# Patient Record
Sex: Female | Born: 1970 | Race: Black or African American | Hispanic: No | Marital: Single | Smoking: Never smoker
Health system: Southern US, Community
[De-identification: ages and names within clinical notes are randomized; demographics above are authoritative.]

## PROBLEM LIST (undated history)

## (undated) DIAGNOSIS — J45909 Unspecified asthma, uncomplicated: Secondary | ICD-10-CM

---

## 1999-11-19 ENCOUNTER — Encounter: Payer: Self-pay | Admitting: Emergency Medicine

## 1999-11-19 ENCOUNTER — Emergency Department (HOSPITAL_COMMUNITY): Admission: EM | Admit: 1999-11-19 | Discharge: 1999-11-19 | Payer: Self-pay | Admitting: Emergency Medicine

## 2008-10-30 ENCOUNTER — Emergency Department (HOSPITAL_COMMUNITY): Admission: EM | Admit: 2008-10-30 | Discharge: 2008-10-30 | Payer: Self-pay | Admitting: Emergency Medicine

## 2008-11-10 ENCOUNTER — Encounter: Admission: RE | Admit: 2008-11-10 | Discharge: 2008-11-10 | Payer: Self-pay | Admitting: Internal Medicine

## 2009-01-30 ENCOUNTER — Emergency Department (HOSPITAL_COMMUNITY): Admission: EM | Admit: 2009-01-30 | Discharge: 2009-01-30 | Payer: Self-pay | Admitting: Family Medicine

## 2009-07-13 ENCOUNTER — Emergency Department (HOSPITAL_COMMUNITY): Admission: EM | Admit: 2009-07-13 | Discharge: 2009-07-13 | Payer: Self-pay | Admitting: Family Medicine

## 2009-07-16 ENCOUNTER — Ambulatory Visit (HOSPITAL_COMMUNITY): Admission: RE | Admit: 2009-07-16 | Discharge: 2009-07-16 | Payer: Self-pay | Admitting: Orthopedic Surgery

## 2010-10-06 ENCOUNTER — Ambulatory Visit (HOSPITAL_COMMUNITY): Admission: RE | Admit: 2010-10-06 | Discharge: 2010-10-06 | Payer: Self-pay | Admitting: Chiropractic Medicine

## 2011-04-13 ENCOUNTER — Ambulatory Visit: Payer: Self-pay | Admitting: Physical Therapy

## 2011-04-20 ENCOUNTER — Ambulatory Visit: Payer: Self-pay | Admitting: Physical Therapy

## 2012-11-20 ENCOUNTER — Other Ambulatory Visit (HOSPITAL_COMMUNITY): Payer: Self-pay | Admitting: Internal Medicine

## 2012-11-20 ENCOUNTER — Encounter (HOSPITAL_COMMUNITY): Payer: Self-pay

## 2012-11-20 ENCOUNTER — Ambulatory Visit (HOSPITAL_COMMUNITY)
Admission: RE | Admit: 2012-11-20 | Discharge: 2012-11-20 | Disposition: A | Discharge status: Home / Self Care | Payer: 59 | Source: Ambulatory Visit | Attending: Internal Medicine | Admitting: Internal Medicine

## 2012-11-20 DIAGNOSIS — R197 Diarrhea, unspecified: Secondary | ICD-10-CM | POA: Insufficient documentation

## 2012-11-20 DIAGNOSIS — R109 Unspecified abdominal pain: Secondary | ICD-10-CM

## 2012-11-20 DIAGNOSIS — M51379 Other intervertebral disc degeneration, lumbosacral region without mention of lumbar back pain or lower extremity pain: Secondary | ICD-10-CM | POA: Insufficient documentation

## 2012-11-20 DIAGNOSIS — R599 Enlarged lymph nodes, unspecified: Secondary | ICD-10-CM | POA: Insufficient documentation

## 2012-11-20 DIAGNOSIS — M5137 Other intervertebral disc degeneration, lumbosacral region: Secondary | ICD-10-CM | POA: Insufficient documentation

## 2012-11-20 DIAGNOSIS — R1031 Right lower quadrant pain: Secondary | ICD-10-CM | POA: Insufficient documentation

## 2012-11-20 HISTORY — DX: Unspecified asthma, uncomplicated: J45.909

## 2013-08-25 ENCOUNTER — Ambulatory Visit (INDEPENDENT_AMBULATORY_CARE_PROVIDER_SITE_OTHER): Payer: 59 | Admitting: Podiatry

## 2013-08-25 ENCOUNTER — Encounter: Payer: Self-pay | Admitting: Podiatry

## 2013-08-25 VITALS — BP 111/77 | HR 55 | Ht 65.0 in | Wt 150.0 lb

## 2013-08-25 DIAGNOSIS — L603 Nail dystrophy: Secondary | ICD-10-CM

## 2013-08-25 DIAGNOSIS — B351 Tinea unguium: Secondary | ICD-10-CM | POA: Insufficient documentation

## 2013-08-25 DIAGNOSIS — L608 Other nail disorders: Secondary | ICD-10-CM

## 2013-08-25 MED ORDER — CICLOPIROX 8 % EX SOLN: Freq: Every day | CUTANEOUS | Status: DC

## 2013-08-25 NOTE — Progress Notes (Signed)
Noted discolored and deformed nail two weeks ago.   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  Endocrine ROS: negative Breast ROS: negative for breast lumps Respiratory ROS: no cough, shortness of breath, or wheezing Gastrointestinal ROS: no abdominal pain, change in bowel habits, or black or bloody stools Genito-Urinary ROS: no dysuria, trouble voiding, or hematuria Musculoskeletal ROS: left knee cap pops and wears knee brace. Diabnosed for [employer].  Neurological ROS: no TIA or stroke symptoms Dermatological ROS: Only with discolored toe nails.   Objective: Thick dystrophic nails 1st and 2nd right. Discolored nails x 10. Neurovascular status are within normal.  Assessment: Onychomycosis x 10. With severe deformity on right hallux.  Plan: Reviewed all available treatment options.  Will try Penlac for the next 12 months.

## 2013-08-25 NOTE — Patient Instructions (Addendum)
Seen for deformed fungal infected right great toe. May benefit from Penlac topical medication.  Will continue for the next 12 months.  Maintain both feet clean and dry.  Return in 1 month.

## 2013-09-24 ENCOUNTER — Ambulatory Visit: Payer: 59 | Admitting: Podiatry

## 2013-10-14 ENCOUNTER — Other Ambulatory Visit (HOSPITAL_COMMUNITY): Payer: Self-pay | Admitting: Obstetrics and Gynecology

## 2013-10-14 DIAGNOSIS — Z1231 Encounter for screening mammogram for malignant neoplasm of breast: Secondary | ICD-10-CM

## 2013-11-19 ENCOUNTER — Ambulatory Visit (HOSPITAL_COMMUNITY): Payer: 59

## 2013-12-10 ENCOUNTER — Ambulatory Visit (HOSPITAL_COMMUNITY): Payer: 59 | Attending: Obstetrics and Gynecology

## 2013-12-17 ENCOUNTER — Emergency Department (HOSPITAL_COMMUNITY): Payer: 59

## 2013-12-17 ENCOUNTER — Emergency Department (HOSPITAL_COMMUNITY)
Admission: EM | Admit: 2013-12-17 | Discharge: 2013-12-17 | Disposition: A | Discharge status: Home / Self Care | Payer: 59 | Attending: Emergency Medicine | Admitting: Emergency Medicine

## 2013-12-17 ENCOUNTER — Encounter (HOSPITAL_COMMUNITY): Payer: Self-pay | Admitting: Emergency Medicine

## 2013-12-17 DIAGNOSIS — S40019A Contusion of unspecified shoulder, initial encounter: Secondary | ICD-10-CM | POA: Insufficient documentation

## 2013-12-17 DIAGNOSIS — W010XXA Fall on same level from slipping, tripping and stumbling without subsequent striking against object, initial encounter: Secondary | ICD-10-CM | POA: Insufficient documentation

## 2013-12-17 DIAGNOSIS — S40011A Contusion of right shoulder, initial encounter: Secondary | ICD-10-CM

## 2013-12-17 DIAGNOSIS — Y939 Activity, unspecified: Secondary | ICD-10-CM | POA: Insufficient documentation

## 2013-12-17 DIAGNOSIS — Y929 Unspecified place or not applicable: Secondary | ICD-10-CM | POA: Insufficient documentation

## 2013-12-17 DIAGNOSIS — IMO0002 Reserved for concepts with insufficient information to code with codable children: Secondary | ICD-10-CM | POA: Insufficient documentation

## 2013-12-17 DIAGNOSIS — S43401A Unspecified sprain of right shoulder joint, initial encounter: Secondary | ICD-10-CM

## 2013-12-17 DIAGNOSIS — J45909 Unspecified asthma, uncomplicated: Secondary | ICD-10-CM | POA: Insufficient documentation

## 2013-12-17 DIAGNOSIS — W108XXA Fall (on) (from) other stairs and steps, initial encounter: Secondary | ICD-10-CM | POA: Insufficient documentation

## 2013-12-17 DIAGNOSIS — Z79899 Other long term (current) drug therapy: Secondary | ICD-10-CM | POA: Insufficient documentation

## 2013-12-17 MED ORDER — IBUPROFEN 800 MG PO TABS
800.0000 mg | ORAL_TABLET | Freq: Once | ORAL | Status: AC
  Administered 2013-12-17: 800 mg via ORAL
  Filled 2013-12-17: qty 1

## 2013-12-17 MED ORDER — TRAMADOL HCL 50 MG PO TABS: 50.0000 mg | ORAL_TABLET | Freq: Four times a day (QID) | ORAL | Status: AC | PRN

## 2013-12-17 MED ORDER — ACETAMINOPHEN 325 MG PO TABS
650.0000 mg | ORAL_TABLET | Freq: Once | ORAL | Status: AC
  Administered 2013-12-17: 650 mg via ORAL
  Filled 2013-12-17: qty 2

## 2013-12-17 NOTE — Discharge Instructions (Signed)

## 2013-12-17 NOTE — ED Provider Notes (Signed)
CSN: [id number]     Arrival date & time 12/17/13  [id number] History   First MD Initiated Contact with Patient 12/17/13 [phone]     Chief Complaint  Patient presents with  . Fall  . Shoulder Pain   (Consider location/radiation/quality/duration/timing/severity/associated sxs/prior Treatment) Patient is a 43 y.o. female presenting with fall and shoulder pain. The history is provided by the patient.  Fall This is a new (slipped on the ice and fell down 5 stairs) problem. The current episode started less than 1 hour ago. The problem occurs constantly. The problem has not changed since onset.Associated symptoms comments: Pain in the back of right shoulder and upper back pain.  Also tailbone pain.  No head injury, LOC or neck pain.  . The symptoms are aggravated by twisting and bending. The symptoms are relieved by rest. She has tried rest for the symptoms. The treatment provided no relief.  Shoulder Pain    Past Medical History  Diagnosis Date  . Asthma    History reviewed. No pertinent past surgical history. History reviewed. No pertinent family history. History  Substance Use Topics  . Smoking status: Never Smoker   . Smokeless tobacco: Never Used  . Alcohol Use: Not on file   OB History   Grav Para Term Preterm Abortions TAB SAB Ect Mult Living                 Review of Systems  Neurological: Negative for weakness and numbness.  All other systems reviewed and are negative.    Allergies  Review of patient's allergies indicates no known allergies.  Home Medications   Current Outpatient Rx  Name  Route  Sig  Dispense  Refill  . meloxicam (MOBIC) 15 MG tablet   Oral   Take 15 mg by mouth as needed for pain.         Marland Kitchen. albuterol (PROAIR HFA) 108 (90 BASE) MCG/ACT inhaler   Inhalation   Inhale 2 puffs into the lungs every 6 (six) hours as needed for wheezing.          BP 111/87  Pulse 78  Temp(Src) 97.6 F (36.4 C) (Oral)  Resp 14  SpO2 97% Physical Exam  Nursing note  and vitals reviewed. Constitutional: She is oriented to person, place, and time. She appears well-developed and well-nourished. No distress.  HENT:  Head: Normocephalic and atraumatic.  Eyes: EOM are normal. Pupils are equal, round, and reactive to light.  Neck: No spinous process tenderness and no muscular tenderness present.  Cardiovascular: Normal rate and intact distal pulses.   Pulmonary/Chest: Effort normal.  Musculoskeletal:       Right shoulder: She exhibits bony tenderness. She exhibits no deformity and normal strength.       Right elbow: Normal.      Right wrist: Normal.       Thoracic back: She exhibits tenderness, bony tenderness, pain and spasm. She exhibits no deformity and normal pulse.       Back:  Pain over the right scapula, tenderness with ROM.  No clavicular tenderness.    Neurological: She is alert and oriented to person, place, and time. She has normal strength. No sensory deficit.  Skin: Skin is warm and dry. No rash noted. No erythema.  Psychiatric: She has a normal mood and affect. Her behavior is normal.    ED Course  Procedures (including critical care time) Labs Review Labs Reviewed - No data to display Imaging Review Dg Thoracic Spine W/swimmers  12/17/2013  CLINICAL DATA:  Pain between shoulder blades and in right shoulder, fell down stairs this morning  EXAM: THORACIC SPINE - 2 VIEW + SWIMMERS  COMPARISON:  10/06/2010  FINDINGS: Twelve pairs of ribs.  Vertebral body and disc space heights maintained.  No acute fracture, subluxation or bone destruction.  Visualized posterior ribs intact.  IMPRESSION: No acute osseous abnormalities.   Electronically Signed   By: Ulyses Southward M.D.   On: 12/17/2013 09:25   Dg Shoulder Right  12/17/2013   CLINICAL DATA:  43 year old female status post fall downstairs with pain. Initial encounter.  EXAM: RIGHT SHOULDER - 2+ VIEW  COMPARISON:  None.  FINDINGS: Bone mineralization is within normal limits. No glenohumeral joint  dislocation. Proximal right humerus intact. Right clavicle and scapula appear intact. Visualized right ribs and lung parenchyma within normal limits.  IMPRESSION: No acute fracture or dislocation identified about the right shoulder.   Electronically Signed   By: Augusto Gamble M.D.   On: 12/17/2013 09:20    EKG Interpretation   None       MDM   1. Fall down stairs   2. Sprain of shoulder, right   3. Contusion of scapula, right     Patient with a mechanical fall when she slipped on the ice this morning. She is complaining of posterior right shoulder and thoracic pain. No headache head injury or LOC. No right elbow or wrist pain 2+ radial pulse normal sensation and strength of the hands. Also complaining of coccyx pain but is able to walk without difficulty.  Plain film of the shoulder and thoracic spine pending. Patient given ibuprofen and Tylenol for pain.  9:27 AM Plain films neg.  Will d/c home with supportive care.  Gwyneth Sprout, MD 12/17/13 [phone]

## 2013-12-17 NOTE — ED Notes (Signed)
Per pt, coming down stairs and fell striking rt arm.  C/O pain to rt shoulder and neck.  No LOC.  States did not strike head.

## 2016-02-12 ENCOUNTER — Encounter (HOSPITAL_BASED_OUTPATIENT_CLINIC_OR_DEPARTMENT_OTHER): Payer: Self-pay | Admitting: Emergency Medicine

## 2016-02-12 DIAGNOSIS — J45909 Unspecified asthma, uncomplicated: Secondary | ICD-10-CM | POA: Diagnosis not present

## 2016-02-12 DIAGNOSIS — Z79899 Other long term (current) drug therapy: Secondary | ICD-10-CM | POA: Insufficient documentation

## 2016-02-12 DIAGNOSIS — J069 Acute upper respiratory infection, unspecified: Secondary | ICD-10-CM | POA: Insufficient documentation

## 2016-02-12 DIAGNOSIS — J029 Acute pharyngitis, unspecified: Secondary | ICD-10-CM | POA: Diagnosis present

## 2016-02-12 LAB — RAPID STREP SCREEN (MED CTR MEBANE ONLY): Streptococcus, Group A Screen (Direct): NEGATIVE

## 2016-02-12 NOTE — ED Notes (Signed)
Patient states that she is having chills and ached x 2 -3 days. Also reports a sore throat. The patient states that she came in tonight because it is not getting any better

## 2016-02-13 ENCOUNTER — Emergency Department (HOSPITAL_BASED_OUTPATIENT_CLINIC_OR_DEPARTMENT_OTHER)
Admission: EM | Admit: 2016-02-13 | Discharge: 2016-02-13 | Disposition: A | Discharge status: Home / Self Care | Payer: BC Managed Care – PPO | Attending: Emergency Medicine | Admitting: Emergency Medicine

## 2016-02-13 DIAGNOSIS — J302 Other seasonal allergic rhinitis: Secondary | ICD-10-CM

## 2016-02-13 DIAGNOSIS — J069 Acute upper respiratory infection, unspecified: Secondary | ICD-10-CM

## 2016-02-13 MED ORDER — FLUTICASONE PROPIONATE 50 MCG/ACT NA SUSP: 2.0000 | Freq: Every day | NASAL | Status: AC

## 2016-02-13 MED ORDER — IBUPROFEN 600 MG PO TABS: 600.0000 mg | ORAL_TABLET | Freq: Four times a day (QID) | ORAL | Status: AC | PRN

## 2016-02-13 MED ORDER — IBUPROFEN 400 MG PO TABS
600.0000 mg | ORAL_TABLET | Freq: Once | ORAL | Status: AC
  Administered 2016-02-13: 600 mg via ORAL
  Filled 2016-02-13: qty 1

## 2016-02-13 MED ORDER — LORATADINE 10 MG PO TABS: 10.0000 mg | ORAL_TABLET | Freq: Every day | ORAL | Status: AC

## 2016-02-13 NOTE — Discharge Instructions (Signed)
Allergies An allergy is an abnormal reaction to a substance by the body's defense system (immune system). Allergies can develop at any age. WHAT CAUSES ALLERGIES? An allergic reaction happens when the immune system mistakenly reacts to a normally harmless substance, called an allergen, as if it were harmful. The immune system releases antibodies to fight the substance. Antibodies eventually release a chemical called histamine into the bloodstream. The release of histamine is meant to protect the body from infection, but it also causes discomfort. An allergic reaction can be triggered by:  Eating an allergen.  Inhaling an allergen.  Touching an allergen. WHAT TYPES OF ALLERGIES ARE THERE? There are many types of allergies. Common types include:  Seasonal allergies. People with this type of allergy are usually allergic to substances that are only present during certain seasons, such as molds and pollens.  Food allergies.  Drug allergies.  Insect allergies.  Animal dander allergies. WHAT ARE SYMPTOMS OF ALLERGIES? Possible allergy symptoms include:  Swelling of the lips, face, tongue, mouth, or throat.  Sneezing, coughing, or wheezing.  Nasal congestion.  Tingling in the mouth.  Rash.  Itching.  Itchy, red, swollen areas of skin (hives).  Watery eyes.  Vomiting.  Diarrhea.  Dizziness.  Lightheadedness.  Fainting.  Trouble breathing or swallowing.  Chest tightness.  Rapid heartbeat. HOW ARE ALLERGIES DIAGNOSED? Allergies are diagnosed with a medical and family history and one or more of the following:  Skin tests.  Blood tests.  A food diary. A food diary is a record of all the foods and drinks you have in a day and of all the symptoms you experience.  The results of an elimination diet. An elimination diet involves eliminating foods from your diet and then adding them back in one by one to find out if a certain food causes an allergic reaction. HOW ARE  ALLERGIES TREATED? There is no cure for allergies, but allergic reactions can be treated with medicine. Severe reactions usually need to be treated at a hospital. HOW CAN REACTIONS BE PREVENTED? The best way to prevent an allergic reaction is by avoiding the substance you are allergic to. Allergy shots and medicines can also help prevent reactions in some cases. People with severe allergic reactions may be able to prevent a life-threatening reaction called anaphylaxis with a medicine given right after exposure to the allergen.   This information is not intended to replace advice given to you by your health care provider. Make sure you discuss any questions you have with your health care provider.   Document Released: 02/13/2003 Document Revised: 12/11/2014 Document Reviewed: 09/01/2014 Elsevier Interactive Patient Education 2016 Else[employer] Inc[employer]   Upper Respiratory Infection, Adult Most upper respiratory infections (URIs) are a viral infection of the air passages leading to the lungs. A URI affects the nose, throat, and upper air passages. The most common type of URI is nasopharyngitis and is typically referred to as "the common cold." URIs run their course and usually go away on their own. Most of the time, a URI does not require medical attention, but sometimes a bacterial infection in the upper airways can follow a viral infection. This is called a secondary infection. Sinus and middle ear infections are common types of secondary upper respiratory infections. Bacterial pneumonia can also complicate a URI. A URI can worsen asthma and chronic obstructive pulmonary disease (COPD). Sometimes, these complications can require emergency medical care and may be life threatening.  CAUSES Almost all URIs are caused by viruses. A virus   is a type of germ and can spread from one person to another.  RISKS FACTORS You may be at risk for a URI if:   You smoke.   You have chronic heart or lung disease.  You  have a weakened defense (immune) system.   You are very young or very old.   You have nasal allergies or asthma.  You work in crowded or poorly ventilated areas.  You work in health care facilities or schools. SIGNS AND SYMPTOMS  Symptoms typically develop 2-3 days after you come in contact with a cold virus. Most viral URIs last 7-10 days. However, viral URIs from the influenza virus (flu virus) can last 14-18 days and are typically more severe. Symptoms may include:   Runny or stuffy (congested) nose.   Sneezing.   Cough.   Sore throat.   Headache.   Fatigue.   Fever.   Loss of appetite.   Pain in your forehead, behind your eyes, and over your cheekbones (sinus pain).  Muscle aches.  DIAGNOSIS  Your health care provider may diagnose a URI by:  Physical exam.  Tests to check that your symptoms are not due to another condition such as:  Strep throat.  Sinusitis.  Pneumonia.  Asthma. TREATMENT  A URI goes away on its own with time. It cannot be cured with medicines, but medicines may be prescribed or recommended to relieve symptoms. Medicines may help:  Reduce your fever.  Reduce your cough.  Relieve nasal congestion. HOME CARE INSTRUCTIONS   Take medicines only as directed by your health care provider.   Gargle warm saltwater or take cough drops to comfort your throat as directed by your health care provider.  Use a warm mist humidifier or inhale steam from a shower to increase air moisture. This may make it easier to breathe.  Drink enough fluid to keep your urine clear or pale yellow.   Eat soups and other clear broths and maintain good nutrition.   Rest as needed.   Return to work when your temperature has returned to normal or as your health care provider advises. You may need to stay home longer to avoid infecting others. You can also use a face mask and careful hand washing to prevent spread of the virus.  Increase the usage of  your inhaler if you have asthma.   Do not use any tobacco products, including cigarettes, chewing tobacco, or electronic cigarettes. If you need help quitting, ask your health care provider. PREVENTION  The best way to protect yourself from getting a cold is to practice good hygiene.   Avoid oral or hand contact with people with cold symptoms.   Wash your hands often if contact occurs.  There is no clear evidence that vitamin C, vitamin E, echinacea, or exercise reduces the chance of developing a cold. However, it is always recommended to get plenty of rest, exercise, and practice good nutrition.  SEEK MEDICAL CARE IF:   You are getting worse rather than better.   Your symptoms are not controlled by medicine.   You have chills.  You have worsening shortness of breath.  You have brown or red mucus.  You have yellow or brown nasal discharge.  You have pain in your face, especially when you bend forward.  You have a fever.  You have swollen neck glands.  You have pain while swallowing.  You have white areas in the back of your throat. SEEK IMMEDIATE MEDICAL CARE IF:   You   have severe or persistent:  Headache.  Ear pain.  Sinus pain.  Chest pain.  You have chronic lung disease and any of the following:  Wheezing.  Prolonged cough.  Coughing up blood.  A change in your usual mucus.  You have a stiff neck.  You have changes in your:  Vision.  Hearing.  Thinking.  Mood. MAKE SURE YOU:   Understand these instructions.  Will watch your condition.  Will get help right away if you are not doing well or get worse.   This information is not intended to replace advice given to you by your health care provider. Make sure you discuss any questions you have with your health care provider.   Document Released: 05/16/2001 Document Revised: 04/06/2015 Document Reviewed: 02/25/2014 Elsevier Interactive Patient Education 2016 Elsevier Inc.  

## 2016-02-13 NOTE — ED Provider Notes (Signed)
CSN: [id number]     Arrival date & time 02/12/16  2042 History  By signing my name below, I, Terrance Branch, attest that this documentation has been prepared under the direction and in the presence of Shon Baton, MD. Electronically Signed: Evon Slack, ED Scribe. 02/13/2016. 12:30 AM.     Chief Complaint  Patient presents with  . Sore Throat   Patient is a 45 y.o. female presenting with pharyngitis. The history is provided by the patient. No language interpreter was used.  Sore Throat Pertinent negatives include no shortness of breath.   HPI Comments: LACHRISHA ZIEBARTH is a 45 y.o. female who presents to the Emergency Department complaining of sore throat onset 3 days prior. She rates the severity of her sore throat 8/10. Pt states she has associated fever, chills, cough, rhinorrhea, runny eyes and sneezing. Reports fever to 100 at home. Pt states she has tried Advil allergy with no relief. Reports that she has been around her child who has a cough. She reports Hx of seasonal allergies. Denies nausea, vomiting, diarrhea or SOB.   Past Medical History  Diagnosis Date  . Asthma    History reviewed. No pertinent past surgical history. History reviewed. No pertinent family history. Social History  Substance Use Topics  . Smoking status: Never Smoker   . Smokeless tobacco: Never Used  . Alcohol Use: None   OB History    No data available     Review of Systems  Constitutional: Positive for fever and chills.  HENT: Positive for rhinorrhea, sneezing and sore throat.   Respiratory: Positive for cough. Negative for shortness of breath.   Gastrointestinal: Negative for nausea, vomiting and diarrhea.  All other systems reviewed and are negative.     Allergies  Review of patient's allergies indicates no known allergies.  Home Medications   Prior to Admission medications   Medication Sig Start Date End Date Taking? Authorizing Provider  albuterol (PROAIR HFA) 108 (90 BASE)  MCG/ACT inhaler Inhale 2 puffs into the lungs every 6 (six) hours as needed for wheezing.    Historical Provider, MD  fluticasone (FLONASE) 50 MCG/ACT nasal spray Place 2 sprays into both nostrils daily. 02/13/16   Shon Baton, MD  ibuprofen (ADVIL,MOTRIN) 600 MG tablet Take 1 tablet (600 mg total) by mouth every 6 (six) hours as needed. 02/13/16   Shon Baton, MD  loratadine (CLARITIN) 10 MG tablet Take 1 tablet (10 mg total) by mouth daily. 02/13/16   Shon Baton, MD  meloxicam (MOBIC) 15 MG tablet Take 15 mg by mouth as needed for pain.    Historical Provider, MD  traMADol (ULTRAM) 50 MG tablet Take 1 tablet (50 mg total) by mouth every 6 (six) hours as needed. 12/17/13   Gwyneth Sprout, MD   BP 120/83 mmHg  Pulse 104  Temp(Src) 99.3 F (37.4 C) (Oral)  Resp 16  Ht  (1.651 m)  Wt 185 lb (83.915 kg)  BMI 30.79 kg/m2  SpO2 98%   Physical Exam  Constitutional: She is oriented to person, place, and time. She appears well-developed and well-nourished. No distress.  HENT:  Head: Normocephalic and atraumatic.  Right Ear: External ear normal.  Left Ear: External ear normal.  Mouth/Throat: Oropharynx is clear and moist. No oropharyngeal exudate.  Bilateral TMs clear Postnasal drip noted  Eyes: Pupils are equal, round, and reactive to light.  Neck: Neck supple.  Cardiovascular: Normal rate, regular rhythm and normal heart sounds.   Pulmonary/Chest:  Effort normal and breath sounds normal. No respiratory distress. She has no wheezes.  Abdominal: Soft. There is no tenderness.  Lymphadenopathy:    She has no cervical adenopathy.  Neurological: She is alert and oriented to person, place, and time.  Skin: Skin is warm and dry.  Psychiatric: She has a normal mood and affect.  Nursing note and vitals reviewed.   ED Course  Procedures (including critical care time) DIAGNOSTIC STUDIES: Oxygen Saturation is 98% on RA, normal by my interpretation.    COORDINATION OF  CARE: 12:31 AM-Discussed treatment plan with pt at bedside and pt agreed to plan.     Labs Review Labs Reviewed  [facility])  CULTURE, GROUP A STREP [facility])    Imaging Review No results found.   EKG Interpretation None      MDM   Final diagnoses:  Upper respiratory infection  Seasonal allergies    Patient presents with upper respiratory symptoms 2-3 days. Positive sick contact. Reports chills and MAXIMUM TEMPERATURE of 100 at home. She is nontoxic. Afebrile. Strep screen sent from triage is negative. Symptoms are suggestive of viral URI versus seasonal allergies giving sneezing and itchy eyes. She may have a combination of both. She is otherwise well-appearing. Discussed with patient symptom control including Flonase, Motrin for pain and chills, and daily allergy medication. Patient is agreeable with plan. Will discharge home.  After history, exam, and medical workup I feel the patient has been appropriately medically screened and is safe for discharge home. Pertinent diagnoses were discussed with the patient. Patient was given return precautions.  I personally performed the services described in this documentation, which was scribed in my presence. The recorded information has been reviewed and is accurate.     Shon Batonourtney F Liberti Appleton, MD 02/13/16 [phone]

## 2016-02-15 LAB — CULTURE, GROUP A STREP (THRC)

## 2018-04-12 ENCOUNTER — Other Ambulatory Visit: Payer: Self-pay | Admitting: Obstetrics and Gynecology

## 2018-04-12 DIAGNOSIS — Z1231 Encounter for screening mammogram for malignant neoplasm of breast: Secondary | ICD-10-CM

## 2018-04-15 ENCOUNTER — Ambulatory Visit
Admission: RE | Admit: 2018-04-15 | Discharge: 2018-04-15 | Disposition: A | Discharge status: Home / Self Care | Payer: BC Managed Care – PPO | Source: Ambulatory Visit | Attending: Obstetrics and Gynecology | Admitting: Obstetrics and Gynecology

## 2018-04-15 DIAGNOSIS — Z1231 Encounter for screening mammogram for malignant neoplasm of breast: Secondary | ICD-10-CM

## 2021-06-28 ENCOUNTER — Other Ambulatory Visit (HOSPITAL_COMMUNITY): Payer: Self-pay | Admitting: Otolaryngology

## 2021-06-28 DIAGNOSIS — R42 Dizziness and giddiness: Secondary | ICD-10-CM

## 2021-06-28 DIAGNOSIS — H532 Diplopia: Secondary | ICD-10-CM

## 2021-06-30 ENCOUNTER — Ambulatory Visit (HOSPITAL_COMMUNITY): Admission: RE | Admit: 2021-06-30 | Payer: Managed Care, Other (non HMO) | Source: Ambulatory Visit

## 2021-06-30 ENCOUNTER — Other Ambulatory Visit: Payer: Self-pay

## 2021-06-30 ENCOUNTER — Ambulatory Visit (HOSPITAL_COMMUNITY)
Admission: RE | Admit: 2021-06-30 | Discharge: 2021-06-30 | Disposition: A | Discharge status: Home / Self Care | Payer: Managed Care, Other (non HMO) | Source: Ambulatory Visit | Attending: Otolaryngology | Admitting: Otolaryngology

## 2021-06-30 DIAGNOSIS — H532 Diplopia: Secondary | ICD-10-CM | POA: Diagnosis present

## 2021-06-30 DIAGNOSIS — R42 Dizziness and giddiness: Secondary | ICD-10-CM

## 2021-06-30 MED ORDER — GADOBUTROL 1 MMOL/ML IV SOLN
9.0000 mL | Freq: Once | INTRAVENOUS | Status: AC | PRN
  Administered 2021-06-30: 9 mL via INTRAVENOUS

## 2021-12-06 ENCOUNTER — Other Ambulatory Visit (HOSPITAL_BASED_OUTPATIENT_CLINIC_OR_DEPARTMENT_OTHER): Payer: Self-pay | Admitting: Family Medicine

## 2021-12-06 DIAGNOSIS — Z1231 Encounter for screening mammogram for malignant neoplasm of breast: Secondary | ICD-10-CM

## 2021-12-08 DIAGNOSIS — Z1231 Encounter for screening mammogram for malignant neoplasm of breast: Secondary | ICD-10-CM

## 2023-02-11 IMAGING — MR MR HEAD WO/W CM
14 of 16 series · 40 of 48 positions shown · IV contrast (gadavist)
Comparison: None.

CLINICAL DATA: Vertigo

EXAM:
MRI HEAD WITHOUT AND WITH CONTRAST
TECHNIQUE: Multiplanar, multiecho pulse sequences of the brain and surrounding
structures were obtained without and with intravenous contrast.
CONTRAST:  9mL GADAVIST GADOBUTROL 1 MMOL/ML IV SOLN

[Series 5: DWI · axial · 3.0mm · 0.88mm/px · z∈[-109,+36]mm · 6 of 100 slices shown (1 of 4)]
[im 1/100]
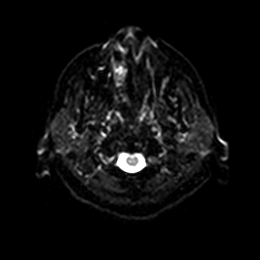
[im 20/100]
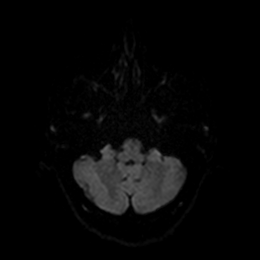
[im 40/100]
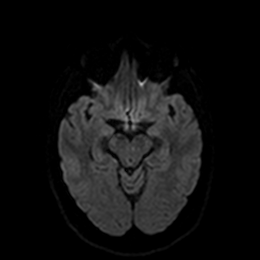
[im 60/100]
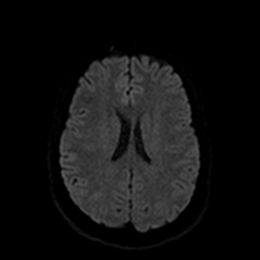
[im 80/100]
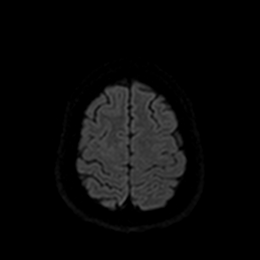
[im 100/100]
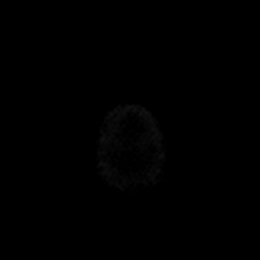

[Series 6: DWI · axial · 3.0mm · 0.88mm/px · z∈[-109,+36]mm · 3 of 50 slices shown (2 of 4)]
[im 1/50]
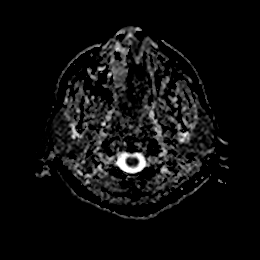
[im 25/50]
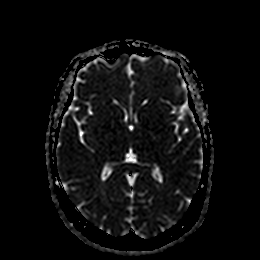
[im 50/50]
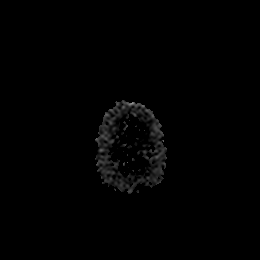

[Series 7: DWI · coronal · 4.0mm · 0.88mm/px · 4 of 68 slices shown (3 of 4)]
[im 1/68]
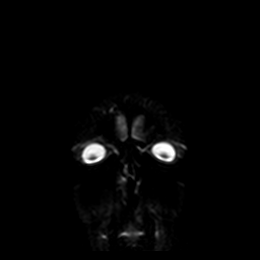
[im 23/68]
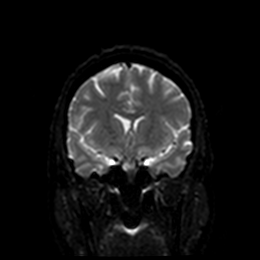
[im 45/68]
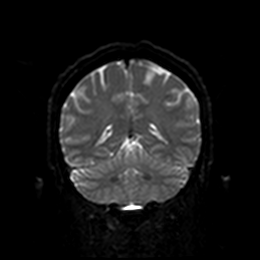
[im 68/68]
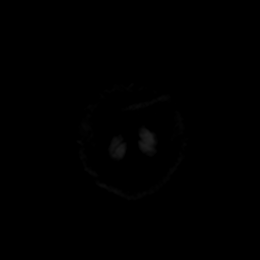

[Series 8: DWI · coronal · 4.0mm · 0.88mm/px · 2 of 34 slices shown (4 of 4)]
[im 1/34]
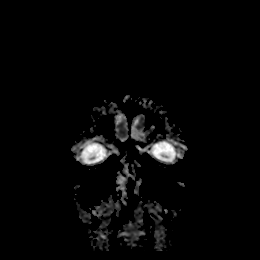
[im 34/34]
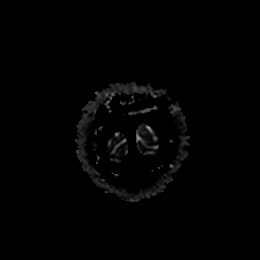

[Series 9: T1 · sagittal · 5.0mm · 0.75mm/px · 1 of 23 slices shown]
[im 1/23]
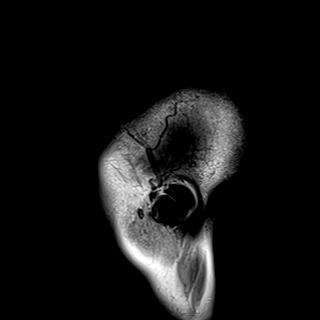

[Series 10: T2 · axial · 5.0mm · 0.72mm/px · z∈[-115,+39]mm · 2 of 27 slices shown]
[im 1/27]
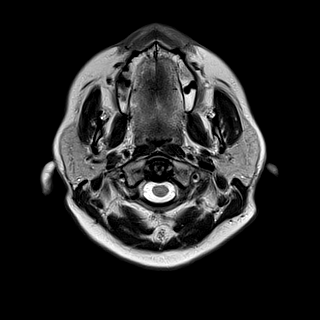
[im 27/27]
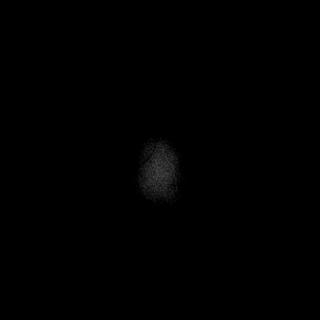

[Series 11: FLAIR · axial · 5.0mm · 0.45mm/px · z∈[-115,+39]mm · 2 of 27 slices shown]
[im 1/27]
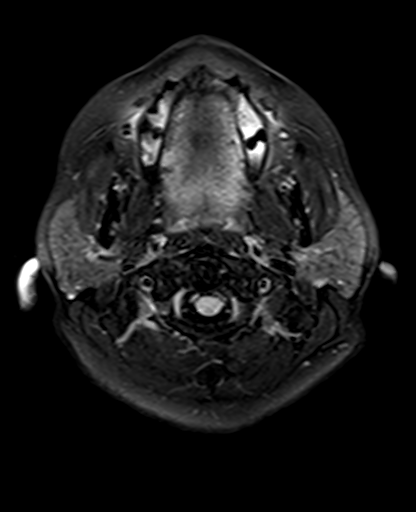
[im 27/27]
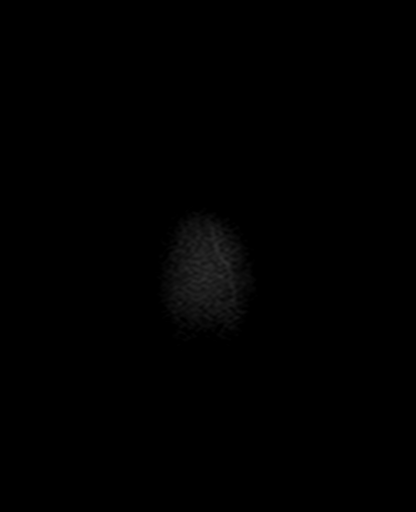

[Series 12: mag_images · axial · 3.0mm · 0.90mm/px · z∈[-124,+51]mm · 4 of 60 slices shown]
[im 1/60]
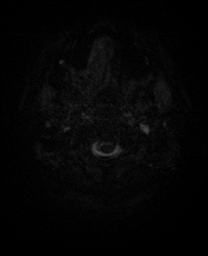
[im 20/60]
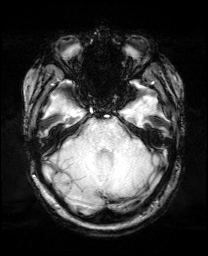
[im 40/60]
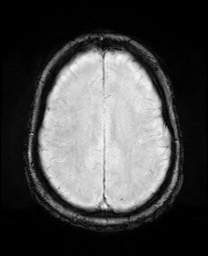
[im 60/60]
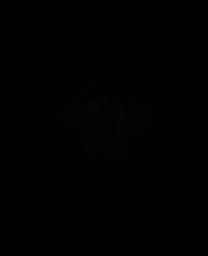

[Series 13: pha_images · axial · 3.0mm · 0.90mm/px · z∈[-124,+45]mm · 4 of 58 slices shown]
[im 1/58]
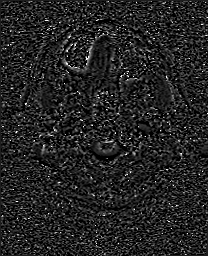
[im 20/58]
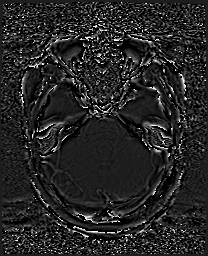
[im 39/58]
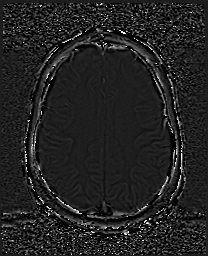
[im 58/58]
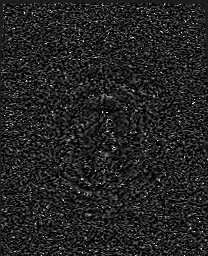

[Series 14: swi_images · axial · 3.0mm · 0.90mm/px · z∈[-124,+51]mm · 4 of 60 slices shown]
[im 1/60]
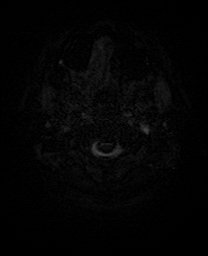
[im 20/60]
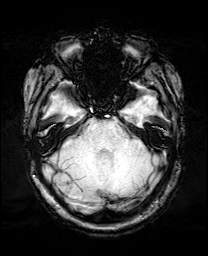
[im 40/60]
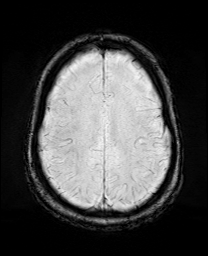
[im 60/60]
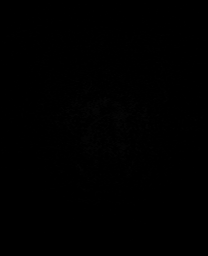

[Series 15: mip_images(sw) · axial · 24.0mm · 0.90mm/px · z∈[-113,+40]mm · 3 of 53 slices shown]
[im 1/53]
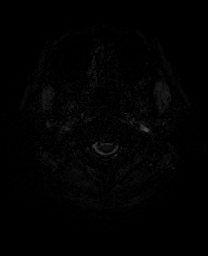
[im 27/53]
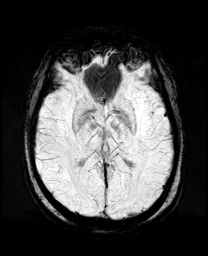
[im 53/53]
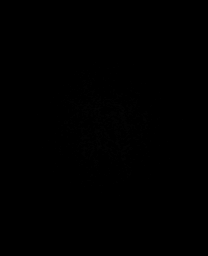

[Series 17: T2 post-contrast · coronal · 5.0mm · 0.72mm/px · 2 of 28 slices shown]
[im 1/28]
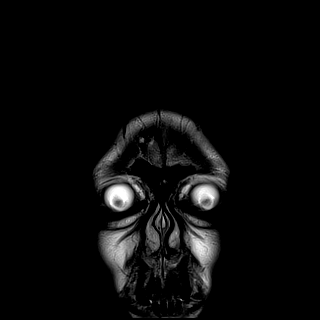
[im 28/28]
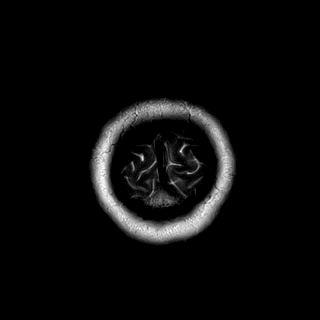

[Series 19: T1 post-contrast · coronal · 5.0mm · 0.34mm/px · 2 of 28 slices shown (1 of 2)]
[im 1/28]
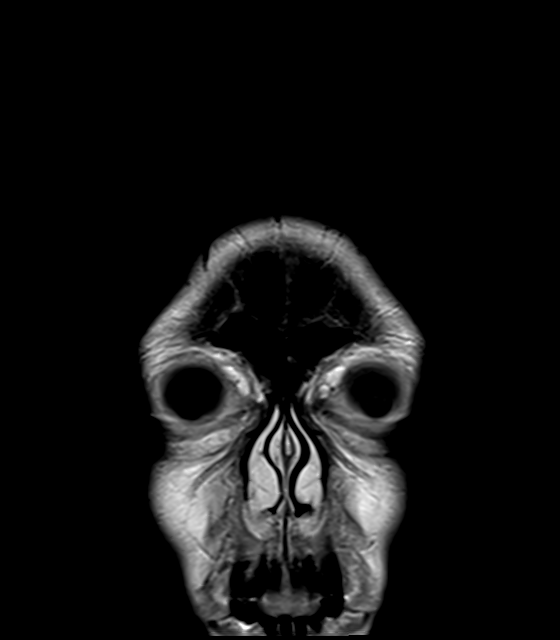
[im 28/28]
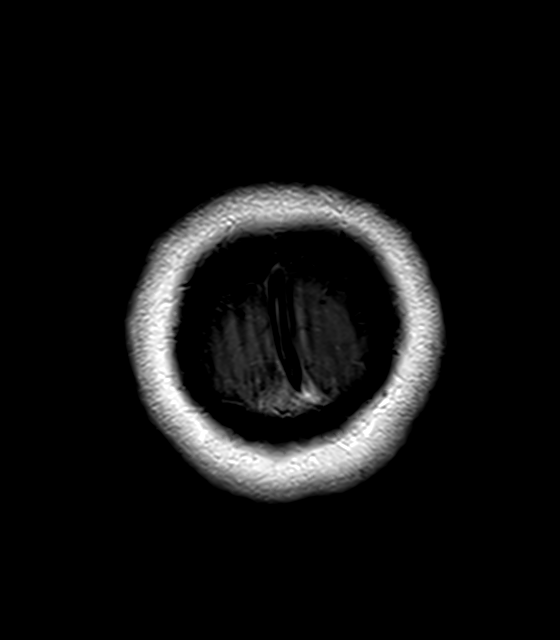

[Series 20: T1 post-contrast · sagittal · 5.0mm · 0.72mm/px · 1 of 23 slices shown (2 of 2)]
[im 1/23]
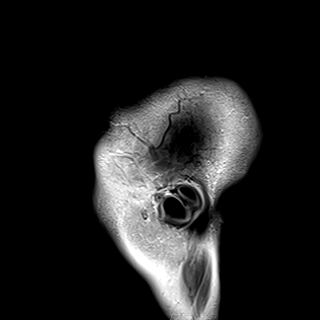

[40 of 48 positions shown; findings below may reference images not displayed]

FINDINGS: Brain: No acute infarction, hemorrhage, hydrocephalus, extra-axial
collection or mass lesion. Normal white matter

Normal enhancement following contrast infusion.

Vascular: Normal arterial flow voids.  Normal venous enhancement.

Skull and upper cervical spine: Negative

Sinuses/Orbits: Mild mucosal edema paranasal sinuses. Negative orbit

Other: None
IMPRESSION: Normal MRI head with contrast.
